# Patient Record
Sex: Female | Born: 1980
Health system: Southern US, Community
[De-identification: ages and names within clinical notes are randomized; demographics above are authoritative.]

---

## 1998-04-21 ENCOUNTER — Emergency Department (HOSPITAL_COMMUNITY): Admission: EM | Admit: 1998-04-21 | Discharge: 1998-04-21 | Payer: Self-pay | Admitting: Emergency Medicine

## 1998-07-16 ENCOUNTER — Emergency Department (HOSPITAL_COMMUNITY): Admission: EM | Admit: 1998-07-16 | Discharge: 1998-07-16 | Payer: Self-pay

## 1998-10-01 ENCOUNTER — Emergency Department (HOSPITAL_COMMUNITY): Admission: EM | Admit: 1998-10-01 | Discharge: 1998-10-01 | Payer: Self-pay | Admitting: Emergency Medicine

## 1999-01-15 ENCOUNTER — Encounter: Payer: Self-pay | Admitting: Emergency Medicine

## 1999-01-15 ENCOUNTER — Emergency Department (HOSPITAL_COMMUNITY): Admission: EM | Admit: 1999-01-15 | Discharge: 1999-01-15 | Payer: Self-pay | Admitting: Emergency Medicine

## 1999-02-17 ENCOUNTER — Emergency Department (HOSPITAL_COMMUNITY): Admission: EM | Admit: 1999-02-17 | Discharge: 1999-02-17 | Payer: Self-pay | Admitting: Emergency Medicine

## 1999-04-02 ENCOUNTER — Emergency Department (HOSPITAL_COMMUNITY): Admission: EM | Admit: 1999-04-02 | Discharge: 1999-04-02 | Payer: Self-pay | Admitting: *Deleted

## 1999-05-24 ENCOUNTER — Encounter: Payer: Self-pay | Admitting: Emergency Medicine

## 1999-05-24 ENCOUNTER — Emergency Department (HOSPITAL_COMMUNITY): Admission: EM | Admit: 1999-05-24 | Discharge: 1999-05-24 | Payer: Self-pay | Admitting: Emergency Medicine

## 1999-05-28 ENCOUNTER — Emergency Department (HOSPITAL_COMMUNITY): Admission: EM | Admit: 1999-05-28 | Discharge: 1999-05-28 | Payer: Self-pay | Admitting: Emergency Medicine

## 1999-09-01 ENCOUNTER — Emergency Department (HOSPITAL_COMMUNITY): Admission: EM | Admit: 1999-09-01 | Discharge: 1999-09-01 | Payer: Self-pay | Admitting: Emergency Medicine

## 1999-09-01 ENCOUNTER — Encounter: Payer: Self-pay | Admitting: Emergency Medicine

## 1999-12-07 ENCOUNTER — Other Ambulatory Visit: Admission: RE | Admit: 1999-12-07 | Discharge: 1999-12-07 | Payer: Self-pay | Admitting: Obstetrics and Gynecology

## 1999-12-08 ENCOUNTER — Encounter (INDEPENDENT_AMBULATORY_CARE_PROVIDER_SITE_OTHER): Payer: Self-pay

## 1999-12-08 ENCOUNTER — Other Ambulatory Visit: Admission: RE | Admit: 1999-12-08 | Discharge: 1999-12-08 | Payer: Self-pay | Admitting: Obstetrics and Gynecology

## 1999-12-12 ENCOUNTER — Inpatient Hospital Stay (HOSPITAL_COMMUNITY): Admission: AD | Admit: 1999-12-12 | Discharge: 1999-12-12 | Payer: Self-pay | Admitting: *Deleted

## 2000-01-20 ENCOUNTER — Encounter: Admission: RE | Admit: 2000-01-20 | Discharge: 2000-04-19 | Payer: Self-pay | Admitting: Obstetrics and Gynecology

## 2000-04-05 ENCOUNTER — Inpatient Hospital Stay (HOSPITAL_COMMUNITY): Admission: AD | Admit: 2000-04-05 | Discharge: 2000-04-08 | Payer: Self-pay | Admitting: *Deleted

## 2000-05-10 ENCOUNTER — Other Ambulatory Visit: Admission: RE | Admit: 2000-05-10 | Discharge: 2000-05-10 | Payer: Self-pay | Admitting: Obstetrics and Gynecology

## 2001-06-04 ENCOUNTER — Other Ambulatory Visit: Admission: RE | Admit: 2001-06-04 | Discharge: 2001-06-04 | Payer: Self-pay | Admitting: Obstetrics and Gynecology

## 2007-01-05 ENCOUNTER — Inpatient Hospital Stay (HOSPITAL_COMMUNITY): Admission: AD | Admit: 2007-01-05 | Discharge: 2007-01-05 | Payer: Self-pay | Admitting: Obstetrics & Gynecology

## 2007-01-24 ENCOUNTER — Other Ambulatory Visit: Admission: RE | Admit: 2007-01-24 | Discharge: 2007-01-24 | Payer: Self-pay | Admitting: Obstetrics and Gynecology

## 2007-02-17 ENCOUNTER — Inpatient Hospital Stay (HOSPITAL_COMMUNITY): Admission: AD | Admit: 2007-02-17 | Discharge: 2007-02-17 | Payer: Self-pay | Admitting: Obstetrics and Gynecology

## 2007-04-02 ENCOUNTER — Ambulatory Visit (HOSPITAL_COMMUNITY): Admission: RE | Admit: 2007-04-02 | Discharge: 2007-04-02 | Payer: Self-pay | Admitting: Obstetrics and Gynecology

## 2007-06-13 ENCOUNTER — Inpatient Hospital Stay (HOSPITAL_COMMUNITY): Admission: AD | Admit: 2007-06-13 | Discharge: 2007-06-13 | Payer: Self-pay | Admitting: Obstetrics and Gynecology

## 2007-08-24 ENCOUNTER — Inpatient Hospital Stay (HOSPITAL_COMMUNITY): Admission: RE | Admit: 2007-08-24 | Discharge: 2007-08-25 | Payer: Self-pay | Admitting: Obstetrics and Gynecology

## 2009-06-08 ENCOUNTER — Encounter (INDEPENDENT_AMBULATORY_CARE_PROVIDER_SITE_OTHER): Payer: Self-pay | Admitting: Obstetrics and Gynecology

## 2009-06-08 ENCOUNTER — Inpatient Hospital Stay (HOSPITAL_COMMUNITY): Admission: AD | Admit: 2009-06-08 | Discharge: 2009-06-09 | Payer: Self-pay | Admitting: Obstetrics and Gynecology

## 2010-12-31 LAB — CBC
HCT: 35.8 % — ABNORMAL LOW (ref 36.0–46.0)
HCT: 39.7 % (ref 36.0–46.0)
Hemoglobin: 13.5 g/dL (ref 12.0–15.0)
MCHC: 33.8 g/dL (ref 30.0–36.0)
MCHC: 34.5 g/dL (ref 30.0–36.0)
MCV: 96 fL (ref 78.0–100.0)
MCV: 96.1 fL (ref 78.0–100.0)
Platelets: 94 10*3/uL — ABNORMAL LOW (ref 150–400)
RBC: 4.13 MIL/uL (ref 3.87–5.11)
RBC: 4.35 MIL/uL (ref 3.87–5.11)
RDW: 14.5 % (ref 11.5–15.5)
WBC: 12 10*3/uL — ABNORMAL HIGH (ref 4.0–10.5)

## 2010-12-31 LAB — COMPREHENSIVE METABOLIC PANEL
AST: 17 U/L (ref 0–37)
Albumin: 2.8 g/dL — ABNORMAL LOW (ref 3.5–5.2)
Calcium: 8.2 mg/dL — ABNORMAL LOW (ref 8.4–10.5)
Chloride: 103 mEq/L (ref 96–112)
Creatinine, Ser: 0.51 mg/dL (ref 0.4–1.2)
GFR calc Af Amer: >60 mL/min (ref >60)
Total Bilirubin: 0.3 mg/dL (ref 0.3–1.2)
Total Protein: 5.8 g/dL — ABNORMAL LOW (ref 6.0–8.3)

## 2010-12-31 LAB — RPR: RPR Ser Ql: NONREACTIVE

## 2011-02-11 NOTE — Discharge Summary (Signed)
[facility]  Patient:    Taylor Vega, Taylor Vega                        MRN: [id number] Adm. Date:  [id number] Disc. Date: [id number] Attending:  Mickle Mallory Dictator:   Leilani Able, P.A.                           Discharge Summary  FINAL DIAGNOSES:              Vacuum-assisted vaginal delivery of a female infant with Apgars of 9 and 9.  HISTORY OF PRESENT ILLNESS:   This 30 year old, G2, P0, presents at term in early labor.  The patients prenatal course has been complicated by smoking and gestational diabetes which is diet controlled.  The patient is admitted at this time in early active labor.  HOSPITAL COURSE:              The patient dilated to complete.  At this point, the patient became exhausted.  The fetal heart tracing remained reactive.  At this point, the Mityvac was placed, and the patient had delivery with the second push over a midline episiotomy.  The patient had a vacuum-assisted vaginal delivery of a 7-pound 7-ounce female infant with Apgars of 9 and 9. There was a midline episiotomy that was repaired.  There was also a right labial abrasion.  The patient and baby tolerated the delivery well.  The patients postpartum course was benign, without significant fevers.  The baby was circumcised on postpartum day #1, and was felt ready for discharge on postpartum day #2.  The patient was sent home on a regular diet, told to decrease activities, told to continue prenatal vitamins.  Was to use over-the-counter pain medicine as needed.  Follow up in the office in four to six weeks. DD:  04/28/00 TD:  04/30/00 Job: [id number]

## 2011-02-11 NOTE — Op Note (Signed)
[facility]  Patient:    ADEN, YOUNGMAN                        MRN: [id number] Proc. Date: 04/06/00 Adm. Date:  [id number] Attending:  Mickle Mallory                           Operative Report  PREOPERATIVE DIAGNOSIS:       Intrauterine pregnancy at 39+[redacted] weeks gestation. Maternal exhaustion.  POSTOPERATIVE DIAGNOSIS:      Intrauterine pregnancy at 39+[redacted] weeks gestation. Maternal exhaustion.  OPERATION:                    Vacuum-assisted vaginal delivery.  SURGEON:                      Conley Simmonds, M.D.  ASSISTANT:  ANESTHESIA:                   Epidural, local with 1% lidocaine.  ESTIMATED BLOOD LOSS:         Less than 500 cc.  COMPLICATIONS:                None.  FINDINGS:                     A viable female infant was born at 11:28 a.m. over  mediolateral episiotomy with Apgars of 9 at one minute and 9 at five minutes. he placenta had a normal configuration and insertion of the three vessel cord.  SPECIMENS:                    None.  INDICATIONS:                  The patient was a 30 year old, gravida 2, para 0-0-1-0, admitted at 39+[redacted] weeks gestation [facility] April 09, 2000), who was admitted in early labor on April 05, 2000, with cervical dilatation of 1+ cm and 70% effacement with a vertex at -2 station.  The fetal heart rate tracing was noted to be reassuring at that time.  Pitocin augmentation was begun several hours later after the patient demonstrated minimal cervical change.  The patient did receive an epidural for anesthesia and she achieved 8 to 9 cm of cervical dilatation with he vertex at 0 station by 7:45 a.m.  Artificial rupture of membranes was performed, and the fluid was noted to be clear.  The patient was complete and began pushing at 8:30 a.m.  The patient was able to bring the vertex down to a +3 station with intermittent good maternal effort.  The patient was exhibiting signs of exhaustion, and the decision was  made to proceed with a vacuum-assisted vaginal delivery after the risks, benefits, and alternatives were reviewed.  The patient wished to proceed.  DESCRIPTION OF PROCEDURE:     With IV Pitocin being administered and an epidural anesthesia in place, the patient was reexamined and the vertex was found to be t the +3 station.  The patient was sterilely catheterized of any remaining urine n the bladder.  The perineum was sterilely prepped and draped, and the Mity-Vac was applied to the fetal vertex in the right occipitoanterior position.  The perineum had been previously injected with lidocaine 1%, and an episiotomy was performed. With two contractions, the delivery was effected and the remainder of the infant was  delivered without difficulty.  The infant was noted to be vigorous at birth. The cord was doubly clamped and cut and cord blood samples were then obtained and set aside.  The placenta delivered spontaneously.  An examination of the cervix and vagina revealed no lacerations.  There was a right labial abrasion which was not bleeding. The episiotomy was repaired in standard fashion with both a 0 Vicryl and 2-0 Vicryl.  The patient did receive Pitocin 20 units intravenously following delivery of the placenta.  There were no complications to the procedure.  All sponge, needle, and instrument counts were correct. DD:  04/06/00 TD:  04/06/00 Job: [id number]

## 2011-02-11 NOTE — Discharge Summary (Signed)
Taylor Vega, Taylor Vega                ACCOUNT NO.:  [id number]   MEDICAL RECORD NO.:  [id number]          PATIENT TYPE:  INP   LOCATION:  9127                          FACILITY:  [facility]   PHYSICIAN:  Rudy Jew. Ashley Royalty, M.D.DATE OF BIRTH:  11-Apr-1981   DATE OF ADMISSION:  08/24/2007  DATE OF DISCHARGE:  08/25/2007                               DISCHARGE SUMMARY   DISCHARGE DIAGNOSES:  1. Intrauterine pregnancy at [redacted] weeks gestation, delivered.  2. History of asthma,  3. Smoker.  4. Group B Strep carrier.  5. Thrombocytopenia - probably gestational.   OPERATIONS AND PROCEDURES:  OB delivery.   CONSULTATIONS:  None.   DISCHARGE MEDICATIONS:  Motrin.   HISTORY AND PHYSICAL:  This 30 year old gravida 3, para 1, AB1 49 weeks,  4 days gestation with the aforementioned risk factors.  The patient  stated his desire for induction secondary to musculoskeletal discomfort  and advanced cervical changes.  The remainder of history and physical  please see chart.   HOSPITAL COURSE:  The patient was admitted to [facility]. Admission laboratory  studies were drawn.  Artificial rupture of membranes was accomplished  which revealed clear fluid.  Intrauterine pressure catheter was placed.  Initial cervical dilatation was 3 cm.  The patient went on to Labor and  Delivery on August 24, 2007.  The infant was a viable female,  delivered by Dr. Edward Jolly while I was in transient to the hospital.  Placenta and membranes were delivered by me.  The infant was a 7 pounds,  1 ounce female, Apgars 9 at 1 minute, 9 at 5 minutes into the newborn  nursery.  There was no episiotomy nor any laceration.  First postpartum  day the patient stated desire for discharge.  The hemoglobin was 12.8.  The platelets were 121,000.  PIH panel was obtained and revealed  subsequent white blood count of 125,000 with normal liver enzymes.  The  patient would like to be discharged home on October 24, 2006 with plan  to return to the office on  September 07, 2007.      James A. Ashley Royalty, M.D.  Electronically Signed     JAM/MEDQ  D:  10/11/2007  T:  10/12/2007  Job:  [id number]

## 2011-07-05 LAB — COMPREHENSIVE METABOLIC PANEL
ALT: 14
Alkaline Phosphatase: 88
BUN: 4 — ABNORMAL LOW
CO2: 27
GFR calc non Af Amer: >60
Glucose, Bld: 111 — ABNORMAL HIGH
Potassium: 3.7
Sodium: 139
Total Bilirubin: 0.5
Total Protein: 5.3 — ABNORMAL LOW

## 2011-07-05 LAB — CBC
HCT: 35.9 — ABNORMAL LOW
HCT: 37.2
Hemoglobin: 12.6
Hemoglobin: 12.8
MCHC: 34.8
Platelets: 121 — ABNORMAL LOW
Platelets: 127 — ABNORMAL LOW
Platelets: 128 — ABNORMAL LOW
RBC: 3.73 — ABNORMAL LOW
RBC: 4.19
RDW: 14.4
RDW: 14.5
WBC: 12 — ABNORMAL HIGH
WBC: 16.5 — ABNORMAL HIGH

## 2011-07-05 LAB — CCBB MATERNAL DONOR DRAW

## 2011-07-05 LAB — URIC ACID: Uric Acid, Serum: 5.4

## 2011-07-05 LAB — LACTATE DEHYDROGENASE: LDH: 123

## 2011-07-07 LAB — CBC
Hemoglobin: 13.4
MCHC: 35.2
Platelets: 168
RDW: 14.3 — ABNORMAL HIGH

## 2011-07-07 LAB — KLEIHAUER-BETKE STAIN
Fetal Cells %: 0
Quantitation Fetal Hemoglobin: 0

## 2015-07-03 ENCOUNTER — Emergency Department (HOSPITAL_COMMUNITY): Payer: 59

## 2015-07-03 ENCOUNTER — Encounter (HOSPITAL_COMMUNITY): Payer: Self-pay | Admitting: Emergency Medicine

## 2015-07-03 ENCOUNTER — Emergency Department (HOSPITAL_COMMUNITY)
Admission: EM | Admit: 2015-07-03 | Discharge: 2015-07-03 | Disposition: A | Discharge status: Home / Self Care | Payer: 59 | Attending: Emergency Medicine | Admitting: Emergency Medicine

## 2015-07-03 DIAGNOSIS — Z3202 Encounter for pregnancy test, result negative: Secondary | ICD-10-CM | POA: Diagnosis not present

## 2015-07-03 DIAGNOSIS — Y9389 Activity, other specified: Secondary | ICD-10-CM | POA: Diagnosis not present

## 2015-07-03 DIAGNOSIS — Y998 Other external cause status: Secondary | ICD-10-CM | POA: Insufficient documentation

## 2015-07-03 DIAGNOSIS — Y92410 Unspecified street and highway as the place of occurrence of the external cause: Secondary | ICD-10-CM | POA: Diagnosis not present

## 2015-07-03 DIAGNOSIS — S0990XA Unspecified injury of head, initial encounter: Secondary | ICD-10-CM | POA: Insufficient documentation

## 2015-07-03 DIAGNOSIS — S29001A Unspecified injury of muscle and tendon of front wall of thorax, initial encounter: Secondary | ICD-10-CM | POA: Insufficient documentation

## 2015-07-03 DIAGNOSIS — F419 Anxiety disorder, unspecified: Secondary | ICD-10-CM | POA: Insufficient documentation

## 2015-07-03 DIAGNOSIS — R000 Tachycardia, unspecified: Secondary | ICD-10-CM | POA: Insufficient documentation

## 2015-07-03 DIAGNOSIS — T148XXA Other injury of unspecified body region, initial encounter: Secondary | ICD-10-CM

## 2015-07-03 DIAGNOSIS — S199XXA Unspecified injury of neck, initial encounter: Secondary | ICD-10-CM | POA: Insufficient documentation

## 2015-07-03 DIAGNOSIS — S0081XA Abrasion of other part of head, initial encounter: Secondary | ICD-10-CM | POA: Insufficient documentation

## 2015-07-03 LAB — HEPATIC FUNCTION PANEL
ALK PHOS: 58 U/L (ref 38–126)
ALT: 23 U/L (ref 14–54)
AST: 23 U/L (ref 15–41)
Albumin: 3.6 g/dL (ref 3.5–5.0)
BILIRUBIN INDIRECT: 0.1 mg/dL — AB (ref 0.3–0.9)
Bilirubin, Direct: 0.1 mg/dL (ref 0.1–0.5)
TOTAL PROTEIN: 6.6 g/dL (ref 6.5–8.1)
Total Bilirubin: 0.2 mg/dL — ABNORMAL LOW (ref 0.3–1.2)

## 2015-07-03 LAB — CBC WITH DIFFERENTIAL/PLATELET
BASOS PCT: 0 %
Basophils Absolute: 0 10*3/uL (ref 0.0–0.1)
EOS ABS: 0 10*3/uL (ref 0.0–0.7)
EOS PCT: 0 %
HCT: 45.5 % (ref 36.0–46.0)
Hemoglobin: 15.3 g/dL — ABNORMAL HIGH (ref 12.0–15.0)
LYMPHS ABS: 1.5 10*3/uL (ref 0.7–4.0)
Lymphocytes Relative: 10 %
MCH: 30.9 pg (ref 26.0–34.0)
MCHC: 33.6 g/dL (ref 30.0–36.0)
MCV: 91.9 fL (ref 78.0–100.0)
MONO ABS: 0.6 10*3/uL (ref 0.1–1.0)
MONOS PCT: 4 %
Neutro Abs: 12.1 10*3/uL — ABNORMAL HIGH (ref 1.7–7.7)
Neutrophils Relative %: 86 %
PLATELETS: 156 10*3/uL (ref 150–400)
RBC: 4.95 MIL/uL (ref 3.87–5.11)
RDW: 13.2 % (ref 11.5–15.5)
WBC: 14.3 10*3/uL — ABNORMAL HIGH (ref 4.0–10.5)

## 2015-07-03 LAB — BASIC METABOLIC PANEL
Anion gap: 10 (ref 5–15)
BUN: 6 mg/dL (ref 6–20)
CALCIUM: 8.9 mg/dL (ref 8.9–10.3)
CHLORIDE: 110 mmol/L (ref 101–111)
CO2: 21 mmol/L — ABNORMAL LOW (ref 22–32)
CREATININE: 0.58 mg/dL (ref 0.44–1.00)
GFR calc Af Amer: >60 mL/min (ref >60)
Glucose, Bld: 105 mg/dL — ABNORMAL HIGH (ref 65–99)
Potassium: 4.1 mmol/L (ref 3.5–5.1)
SODIUM: 141 mmol/L (ref 135–145)

## 2015-07-03 LAB — POC URINE PREG, ED: PREG TEST UR: NEGATIVE

## 2015-07-03 MED ORDER — MORPHINE SULFATE (PF) 4 MG/ML IV SOLN
4.0000 mg | Freq: Once | INTRAVENOUS | Status: AC
  Administered 2015-07-03: 4 mg via INTRAVENOUS
  Filled 2015-07-03: qty 1

## 2015-07-03 MED ORDER — SODIUM CHLORIDE 0.9 % IV BOLUS (SEPSIS)
1000.0000 mL | Freq: Once | INTRAVENOUS | Status: AC
  Administered 2015-07-03: 1000 mL via INTRAVENOUS

## 2015-07-03 MED ORDER — MORPHINE SULFATE (PF) 4 MG/ML IV SOLN: 4.0000 mg | Freq: Once | INTRAVENOUS | Status: DC

## 2015-07-03 MED ORDER — IOHEXOL 300 MG/ML  SOLN
100.0000 mL | Freq: Once | INTRAMUSCULAR | Status: AC | PRN
  Administered 2015-07-03: 100 mL via INTRAVENOUS

## 2015-07-03 MED ORDER — KETOROLAC TROMETHAMINE 15 MG/ML IJ SOLN
15.0000 mg | Freq: Once | INTRAMUSCULAR | Status: AC
  Administered 2015-07-03: 15 mg via INTRAVENOUS
  Filled 2015-07-03: qty 1

## 2015-07-03 MED ORDER — LORAZEPAM 2 MG/ML IJ SOLN
1.0000 mg | Freq: Once | INTRAMUSCULAR | Status: AC
  Administered 2015-07-03: 1 mg via INTRAVENOUS
  Filled 2015-07-03: qty 1

## 2015-07-03 MED ORDER — TETANUS-DIPHTH-ACELL PERTUSSIS 5-2.5-18.5 LF-MCG/0.5 IM SUSP
0.5000 mL | Freq: Once | INTRAMUSCULAR | Status: AC
  Administered 2015-07-03: 0.5 mL via INTRAMUSCULAR
  Filled 2015-07-03: qty 0.5

## 2015-07-03 NOTE — Discharge Instructions (Signed)
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
## 2015-07-03 NOTE — ED Notes (Signed)
Pt states anxiety increasing again.  Requesting more ativan.

## 2015-08-03 ENCOUNTER — Other Ambulatory Visit (HOSPITAL_COMMUNITY): Payer: Self-pay | Admitting: Orthopedic Surgery

## 2015-08-03 DIAGNOSIS — M545 Low back pain: Secondary | ICD-10-CM

## 2015-08-13 ENCOUNTER — Ambulatory Visit (HOSPITAL_COMMUNITY)
Admission: RE | Admit: 2015-08-13 | Discharge: 2015-08-13 | Disposition: A | Discharge status: Home / Self Care | Payer: 59 | Source: Ambulatory Visit | Attending: Orthopedic Surgery | Admitting: Orthopedic Surgery

## 2015-08-13 DIAGNOSIS — M47814 Spondylosis without myelopathy or radiculopathy, thoracic region: Secondary | ICD-10-CM | POA: Diagnosis not present

## 2015-08-13 DIAGNOSIS — M545 Low back pain: Secondary | ICD-10-CM | POA: Diagnosis present

## 2015-08-13 DIAGNOSIS — R531 Weakness: Secondary | ICD-10-CM | POA: Insufficient documentation

## 2015-08-13 DIAGNOSIS — M5124 Other intervertebral disc displacement, thoracic region: Secondary | ICD-10-CM | POA: Insufficient documentation

## 2015-08-13 DIAGNOSIS — M4804 Spinal stenosis, thoracic region: Secondary | ICD-10-CM | POA: Insufficient documentation

## 2016-01-13 DIAGNOSIS — D2372 Other benign neoplasm of skin of left lower limb, including hip: Secondary | ICD-10-CM | POA: Diagnosis not present

## 2016-02-23 DIAGNOSIS — Z0000 Encounter for general adult medical examination without abnormal findings: Secondary | ICD-10-CM | POA: Diagnosis not present

## 2016-02-25 DIAGNOSIS — Z124 Encounter for screening for malignant neoplasm of cervix: Secondary | ICD-10-CM | POA: Diagnosis not present

## 2016-02-25 DIAGNOSIS — E663 Overweight: Secondary | ICD-10-CM | POA: Diagnosis not present

## 2016-02-25 DIAGNOSIS — F410 Panic disorder [episodic paroxysmal anxiety] without agoraphobia: Secondary | ICD-10-CM | POA: Diagnosis not present

## 2016-02-25 DIAGNOSIS — R875 Abnormal microbiological findings in specimens from female genital organs: Secondary | ICD-10-CM | POA: Diagnosis not present

## 2016-02-25 DIAGNOSIS — B373 Candidiasis of vulva and vagina: Secondary | ICD-10-CM | POA: Diagnosis not present

## 2016-02-25 DIAGNOSIS — Z0000 Encounter for general adult medical examination without abnormal findings: Secondary | ICD-10-CM | POA: Diagnosis not present

## 2016-07-13 DIAGNOSIS — J209 Acute bronchitis, unspecified: Secondary | ICD-10-CM | POA: Diagnosis not present

## 2016-07-13 DIAGNOSIS — Z6829 Body mass index (BMI) 29.0-29.9, adult: Secondary | ICD-10-CM | POA: Diagnosis not present

## 2016-07-13 DIAGNOSIS — J019 Acute sinusitis, unspecified: Secondary | ICD-10-CM | POA: Diagnosis not present

## 2016-11-14 DIAGNOSIS — R51 Headache: Secondary | ICD-10-CM | POA: Diagnosis not present

## 2016-11-14 DIAGNOSIS — F419 Anxiety disorder, unspecified: Secondary | ICD-10-CM | POA: Diagnosis not present

## 2016-11-14 DIAGNOSIS — R000 Tachycardia, unspecified: Secondary | ICD-10-CM | POA: Diagnosis not present

## 2016-11-14 DIAGNOSIS — Z6828 Body mass index (BMI) 28.0-28.9, adult: Secondary | ICD-10-CM | POA: Diagnosis not present

## 2017-01-10 DIAGNOSIS — N390 Urinary tract infection, site not specified: Secondary | ICD-10-CM | POA: Diagnosis not present

## 2017-01-10 DIAGNOSIS — Z6829 Body mass index (BMI) 29.0-29.9, adult: Secondary | ICD-10-CM | POA: Diagnosis not present

## 2017-02-21 DIAGNOSIS — N921 Excessive and frequent menstruation with irregular cycle: Secondary | ICD-10-CM | POA: Diagnosis not present

## 2017-02-21 DIAGNOSIS — E663 Overweight: Secondary | ICD-10-CM | POA: Diagnosis not present

## 2017-02-21 DIAGNOSIS — Z6828 Body mass index (BMI) 28.0-28.9, adult: Secondary | ICD-10-CM | POA: Diagnosis not present

## 2017-02-23 DIAGNOSIS — N921 Excessive and frequent menstruation with irregular cycle: Secondary | ICD-10-CM | POA: Diagnosis not present

## 2017-02-23 DIAGNOSIS — N83202 Unspecified ovarian cyst, left side: Secondary | ICD-10-CM | POA: Diagnosis not present

## 2017-04-14 DIAGNOSIS — M79672 Pain in left foot: Secondary | ICD-10-CM | POA: Diagnosis not present

## 2017-04-24 DIAGNOSIS — M24572 Contracture, left ankle: Secondary | ICD-10-CM | POA: Diagnosis not present

## 2017-04-24 DIAGNOSIS — M722 Plantar fascial fibromatosis: Secondary | ICD-10-CM | POA: Diagnosis not present
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
## 2017-09-18 DIAGNOSIS — H6990 Unspecified Eustachian tube disorder, unspecified ear: Secondary | ICD-10-CM | POA: Diagnosis not present

## 2017-09-18 DIAGNOSIS — J0100 Acute maxillary sinusitis, unspecified: Secondary | ICD-10-CM | POA: Diagnosis not present

## 2018-05-29 DIAGNOSIS — M545 Low back pain: Secondary | ICD-10-CM | POA: Diagnosis not present

## 2018-07-02 DIAGNOSIS — J209 Acute bronchitis, unspecified: Secondary | ICD-10-CM | POA: Diagnosis not present

## 2018-07-05 DIAGNOSIS — R002 Palpitations: Secondary | ICD-10-CM | POA: Diagnosis not present

## 2018-07-05 DIAGNOSIS — N926 Irregular menstruation, unspecified: Secondary | ICD-10-CM | POA: Diagnosis not present

## 2018-07-05 DIAGNOSIS — R062 Wheezing: Secondary | ICD-10-CM | POA: Diagnosis not present

## 2019-07-30 DIAGNOSIS — M25571 Pain in right ankle and joints of right foot: Secondary | ICD-10-CM | POA: Diagnosis not present

## 2019-07-30 DIAGNOSIS — Z20828 Contact with and (suspected) exposure to other viral communicable diseases: Secondary | ICD-10-CM | POA: Diagnosis not present

## 2019-07-30 DIAGNOSIS — J309 Allergic rhinitis, unspecified: Secondary | ICD-10-CM | POA: Diagnosis not present

## 2019-08-03 DIAGNOSIS — M25571 Pain in right ankle and joints of right foot: Secondary | ICD-10-CM | POA: Diagnosis not present

## 2019-08-03 DIAGNOSIS — M25471 Effusion, right ankle: Secondary | ICD-10-CM | POA: Diagnosis not present

## 2019-08-07 DIAGNOSIS — M25571 Pain in right ankle and joints of right foot: Secondary | ICD-10-CM | POA: Diagnosis not present

## 2019-08-09 DIAGNOSIS — M899 Disorder of bone, unspecified: Secondary | ICD-10-CM | POA: Diagnosis not present

## 2019-08-09 DIAGNOSIS — S93491S Sprain of other ligament of right ankle, sequela: Secondary | ICD-10-CM | POA: Diagnosis not present

## 2019-08-09 DIAGNOSIS — M659 Synovitis and tenosynovitis, unspecified: Secondary | ICD-10-CM | POA: Diagnosis not present

## 2019-08-09 DIAGNOSIS — M949 Disorder of cartilage, unspecified: Secondary | ICD-10-CM | POA: Diagnosis not present
